# Patient Record
Sex: Male | Born: 1950 | Race: White | Hispanic: No | Marital: Married | Smoking: Never smoker
Health system: Southern US, Community
[De-identification: ages and names within clinical notes are randomized; demographics above are authoritative.]

## PROBLEM LIST (undated history)

## (undated) DIAGNOSIS — N200 Calculus of kidney: Secondary | ICD-10-CM

## (undated) DIAGNOSIS — I10 Essential (primary) hypertension: Secondary | ICD-10-CM

## (undated) DIAGNOSIS — E7800 Pure hypercholesterolemia, unspecified: Secondary | ICD-10-CM

## (undated) HISTORY — PX: HERNIA REPAIR: SHX51

## (undated) HISTORY — PX: CHOLECYSTECTOMY: SHX55

---

## 2014-11-15 ENCOUNTER — Emergency Department (HOSPITAL_BASED_OUTPATIENT_CLINIC_OR_DEPARTMENT_OTHER): Payer: Medicare PPO

## 2014-11-15 ENCOUNTER — Encounter (HOSPITAL_BASED_OUTPATIENT_CLINIC_OR_DEPARTMENT_OTHER): Payer: Self-pay

## 2014-11-15 ENCOUNTER — Emergency Department (HOSPITAL_BASED_OUTPATIENT_CLINIC_OR_DEPARTMENT_OTHER)
Admission: EM | Admit: 2014-11-15 | Discharge: 2014-11-15 | Disposition: A | Discharge status: Home / Self Care | Payer: Medicare PPO | Attending: Emergency Medicine | Admitting: Emergency Medicine

## 2014-11-15 DIAGNOSIS — E780 Pure hypercholesterolemia: Secondary | ICD-10-CM | POA: Insufficient documentation

## 2014-11-15 DIAGNOSIS — R1032 Left lower quadrant pain: Secondary | ICD-10-CM

## 2014-11-15 DIAGNOSIS — Z79899 Other long term (current) drug therapy: Secondary | ICD-10-CM | POA: Diagnosis not present

## 2014-11-15 DIAGNOSIS — I10 Essential (primary) hypertension: Secondary | ICD-10-CM | POA: Diagnosis not present

## 2014-11-15 DIAGNOSIS — N201 Calculus of ureter: Secondary | ICD-10-CM | POA: Insufficient documentation

## 2014-11-15 HISTORY — DX: Calculus of kidney: N20.0

## 2014-11-15 HISTORY — DX: Pure hypercholesterolemia, unspecified: E78.00

## 2014-11-15 HISTORY — DX: Essential (primary) hypertension: I10

## 2014-11-15 LAB — BASIC METABOLIC PANEL
Anion gap: 8 (ref 5–15)
BUN: 16 mg/dL (ref 6–20)
CALCIUM: 9.6 mg/dL (ref 8.9–10.3)
CO2: 26 mmol/L (ref 22–32)
CREATININE: 1.52 mg/dL — AB (ref 0.61–1.24)
Chloride: 104 mmol/L (ref 101–111)
GFR calc non Af Amer: 46 mL/min — ABNORMAL LOW (ref >60)
GFR, EST AFRICAN AMERICAN: 54 mL/min — AB (ref >60)
Glucose, Bld: 119 mg/dL — ABNORMAL HIGH (ref 65–99)
Potassium: 4.4 mmol/L (ref 3.5–5.1)
SODIUM: 138 mmol/L (ref 135–145)

## 2014-11-15 LAB — URINE MICROSCOPIC-ADD ON

## 2014-11-15 LAB — CBC WITH DIFFERENTIAL/PLATELET
BASOS PCT: 0 % (ref 0–1)
Basophils Absolute: 0 10*3/uL (ref 0.0–0.1)
Eosinophils Absolute: 0.8 10*3/uL — ABNORMAL HIGH (ref 0.0–0.7)
Eosinophils Relative: 9 % — ABNORMAL HIGH (ref 0–5)
HEMATOCRIT: 45.8 % (ref 39.0–52.0)
HEMOGLOBIN: 15.6 g/dL (ref 13.0–17.0)
Lymphocytes Relative: 12 % (ref 12–46)
Lymphs Abs: 1.2 10*3/uL (ref 0.7–4.0)
MCH: 28.5 pg (ref 26.0–34.0)
MCHC: 34.1 g/dL (ref 30.0–36.0)
MCV: 83.7 fL (ref 78.0–100.0)
Monocytes Absolute: 0.8 10*3/uL (ref 0.1–1.0)
Monocytes Relative: 9 % (ref 3–12)
NEUTROS ABS: 6.9 10*3/uL (ref 1.7–7.7)
NEUTROS PCT: 70 % (ref 43–77)
PLATELETS: 185 10*3/uL (ref 150–400)
RBC: 5.47 MIL/uL (ref 4.22–5.81)
RDW: 14 % (ref 11.5–15.5)
WBC: 9.8 10*3/uL (ref 4.0–10.5)

## 2014-11-15 LAB — URINALYSIS, ROUTINE W REFLEX MICROSCOPIC
Bilirubin Urine: NEGATIVE
Glucose, UA: NEGATIVE mg/dL
KETONES UR: 15 mg/dL — AB
Leukocytes, UA: NEGATIVE
NITRITE: NEGATIVE
PROTEIN: 30 mg/dL — AB
Specific Gravity, Urine: 1.025 (ref 1.005–1.030)
Urobilinogen, UA: 0.2 mg/dL (ref 0.0–1.0)
pH: 5 (ref 5.0–8.0)

## 2014-11-15 MED ORDER — MORPHINE SULFATE 4 MG/ML IJ SOLN
4.0000 mg | Freq: Once | INTRAMUSCULAR | Status: AC
  Administered 2014-11-15: 4 mg via INTRAVENOUS
  Filled 2014-11-15: qty 1

## 2014-11-15 MED ORDER — ONDANSETRON HCL 4 MG PO TABS: 4.0000 mg | ORAL_TABLET | Freq: Three times a day (TID) | ORAL | Status: AC | PRN

## 2014-11-15 MED ORDER — TAMSULOSIN HCL 0.4 MG PO CAPS: 0.4000 mg | ORAL_CAPSULE | Freq: Every day | ORAL | Status: AC

## 2014-11-15 MED ORDER — ONDANSETRON HCL 4 MG/2ML IJ SOLN
4.0000 mg | Freq: Once | INTRAMUSCULAR | Status: AC
  Administered 2014-11-15: 4 mg via INTRAVENOUS
  Filled 2014-11-15: qty 2

## 2014-11-15 MED ORDER — HYDROCODONE-ACETAMINOPHEN 5-325 MG PO TABS: 1.0000 | ORAL_TABLET | Freq: Four times a day (QID) | ORAL | Status: AC | PRN

## 2014-11-15 NOTE — ED Notes (Signed)
Left groin pain, nausea x 2-3 hours

## 2014-11-15 NOTE — Discharge Instructions (Signed)

## 2014-11-15 NOTE — ED Provider Notes (Signed)
CSN: [id number]     Arrival date & time 11/15/14  1513 History  This chart was scribed for Jesus Divine, MD by Placido Sou, ED scribe. This patient was seen in room MH12/MH12 and the patient's care was started at 5:11 PM.   Chief Complaint  Patient presents with  . Groin Pain   Patient is a 64 y.o. male presenting with groin pain. The history is provided by the patient. No language interpreter was used.  Groin Pain This is a new problem. The current episode started 3 to 5 hours ago. The problem occurs constantly. The problem has been gradually worsening. Pertinent negatives include no abdominal pain. Nothing aggravates the symptoms. Nothing relieves the symptoms. He has tried acetaminophen for the symptoms. The treatment provided no relief.    HPI Comments: Jesus Coleman is a 64 y.o. male who presents to the Emergency Department complaining of worsening, moderate, left sided groin pain with onset 5 hours ago. He notes associated n/v with 2x vomiting since arriving at [facility], rates his current pain as a 6/10, describes it as sharp and further notes taking 2x tylenol 5 hours ago with no relief of his symptoms. Pt notes a hx of kidney stones and further notes the pain was more focused in his flanks and more severe than this occurrence. He denies any changes in his BM's, swelling in the affected region, fever, chills, abd pain or back pain.   Past Medical History  Diagnosis Date  . Kidney stone   . High cholesterol   . Hypertension    Past Surgical History  Procedure Laterality Date  . Cholecystectomy    . Hernia repair     No family history on file. History  Substance Use Topics  . Smoking status: Never Smoker   . Smokeless tobacco: Not on file  . Alcohol Use: No    Review of Systems  Constitutional: Negative for fever and chills.  Gastrointestinal: Positive for nausea and vomiting. Negative for abdominal pain, diarrhea and constipation.  Musculoskeletal: Positive for  myalgias. Negative for back pain.  All other systems reviewed and are negative.  Allergies  Review of patient's allergies indicates no known allergies.  Home Medications   Prior to Admission medications   Medication Sig Start Date End Date Taking? Authorizing Provider  amLODipine-benazepril (LOTREL) 5-10 MG per capsule Take 1 capsule by mouth daily.   Yes Historical Provider, MD  simvastatin (ZOCOR) 20 MG tablet Take 20 mg by mouth daily.   Yes Historical Provider, MD   BP 154/84 mmHg  Pulse 68  Temp(Src) 98.2 F (36.8 C) (Oral)  Resp 18  Ht  (1.803 m)  Wt 215 lb (97.523 kg)  BMI 30.00 kg/m2  SpO2 97% Physical Exam  Constitutional: He is oriented to person, place, and time. He appears well-developed and well-nourished. No distress.  HENT:  Head: Normocephalic and atraumatic.  Mouth/Throat: Oropharynx is clear and moist.  Eyes: Conjunctivae are normal. Pupils are equal, round, and reactive to light. No scleral icterus.  Neck: Neck supple.  Cardiovascular: Normal rate, regular rhythm, normal heart sounds and intact distal pulses.   No murmur heard. Pulmonary/Chest: Effort normal and breath sounds normal. No stridor. No respiratory distress. He has no wheezes. He has no rales.  Abdominal: Soft. He exhibits no distension. There is no tenderness. There is no rebound and no guarding. Hernia confirmed negative in the right inguinal area and confirmed negative in the left inguinal area.  Genitourinary: Right testis shows no mass  and no tenderness. Left testis shows no mass and no tenderness.  Musculoskeletal: Normal range of motion. He exhibits no edema.  Neurological: He is alert and oriented to person, place, and time.  Skin: Skin is warm and dry. No rash noted.  Psychiatric: He has a normal mood and affect. His behavior is normal.  Nursing note and vitals reviewed.   ED Course  Procedures  DIAGNOSTIC STUDIES: Oxygen Saturation is 97% on RA, normal by my interpretation.     COORDINATION OF CARE: 5:20 PM Discussed treatment plan with pt at bedside and pt agreed to plan.  Labs Review Labs Reviewed  URINALYSIS, ROUTINE W REFLEX MICROSCOPIC (NOT AT [facility]) - Abnormal; Notable for the following:    Color, Urine AMBER (*)    APPearance CLOUDY (*)    Hgb urine dipstick LARGE (*)    Ketones, ur 15 (*)    Protein, ur 30 (*)    All other components within normal limits  URINE MICROSCOPIC-ADD ON - Abnormal; Notable for the following:    Bacteria, UA MANY (*)    Casts GRANULAR CAST (*)    All other components within normal limits  CBC WITH DIFFERENTIAL/PLATELET - Abnormal; Notable for the following:    Eosinophils Relative 9 (*)    Eosinophils Absolute 0.8 (*)    All other components within normal limits  BASIC METABOLIC PANEL - Abnormal; Notable for the following:    Glucose, Bld 119 (*)    Creatinine, Ser 1.52 (*)    GFR calc non Af Amer 46 (*)    GFR calc Af Amer 54 (*)    All other components within normal limits   Imaging Review Ct Renal Stone Study  11/15/2014   CLINICAL DATA:  Left groin pain  EXAM: CT ABDOMEN AND PELVIS WITHOUT CONTRAST  TECHNIQUE: Multidetector CT imaging of the abdomen and pelvis was performed following the standard protocol without IV contrast.  COMPARISON:  None.  FINDINGS: There is a 2 mm calculus at the left ureterovesical junction associated with mild left hydronephrosis and perinephric stranding. Tiny calculus in the upper pole of the left kidney. There are several benign cysts in the right kidney.  Postcholecystectomy  Liver, spleen, pancreas, adrenal glands are within normal limits.  Sigmoid diverticulosis without acute diverticulitis  Mild prominence of the prostate gland.  Bladder is unremarkable.  No free-fluid.  Multilevel lumbar spine degenerative disc disease.  IMPRESSION: 2 mm calculus at the left ureterovesical junction is associated with secondary findings of left ureteral obstruction.  Left nephrolithiasis.    Electronically Signed   By: Jolaine Click M.D.   On: 11/15/2014 17:52     EKG Interpretation None     MDM   Final diagnoses:  Left groin pain  Left ureteral stone    64 yo male with left groin pain.  No testicular tenderness or hernias.  Symptoms felt to be secondary to left distal ureteral stone.  Pain controlled.  He will follow up with urology.     I personally performed the services described in this documentation, which was scribed in my presence. The recorded information has been reviewed and is accurate.     Jesus Divine, MD 11/15/14 [phone]

## 2017-01-02 IMAGING — CT CT RENAL STONE PROTOCOL
2 of 4 series · 17 of 46 positions shown, 19 images · non-contrast
Comparison: None.

CLINICAL DATA: Left groin pain

EXAM:
CT ABDOMEN AND PELVIS WITHOUT CONTRAST
TECHNIQUE: Multidetector CT imaging of the abdomen and pelvis was performed
following the standard protocol without IV contrast.

[Series 2: renal stone > 200 lbs 5.0 b31f · axial · 0.87mm/px · z∈[-487,-47]mm · 14 of 97 slices shown, 16 images]
[im 5/97  soft-tissue]
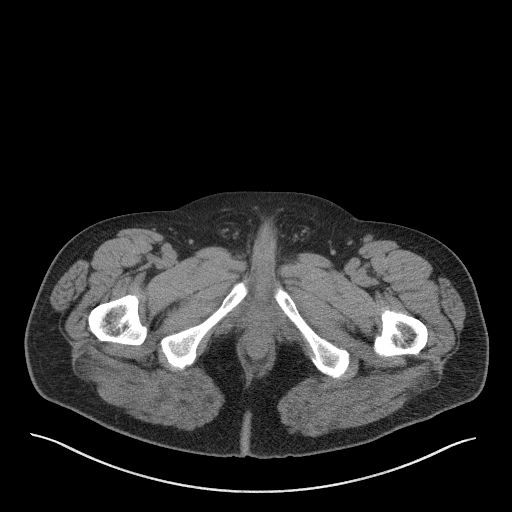
[im 5/97  bone]
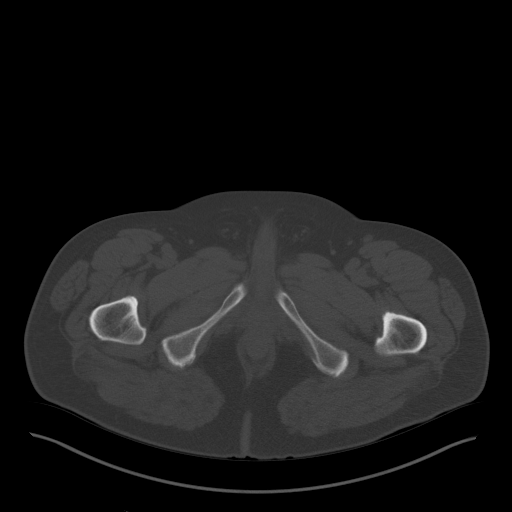
[im 13/97  soft-tissue]
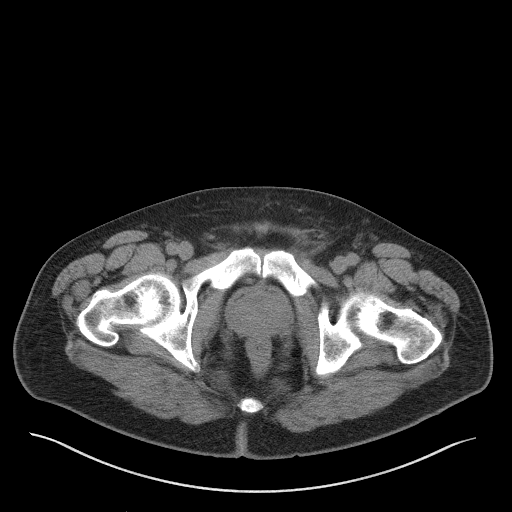
[im 21/97  soft-tissue]
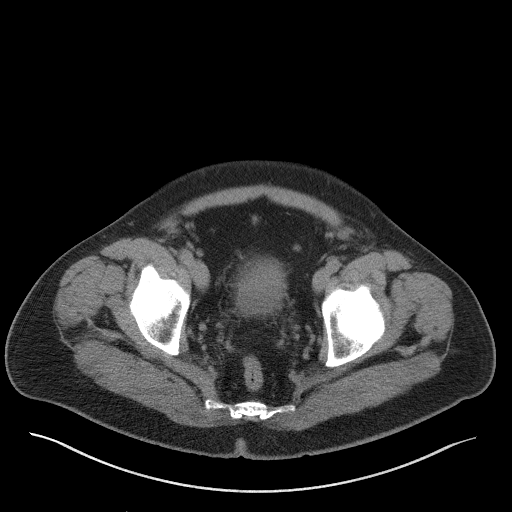
[im 25/97  soft-tissue]
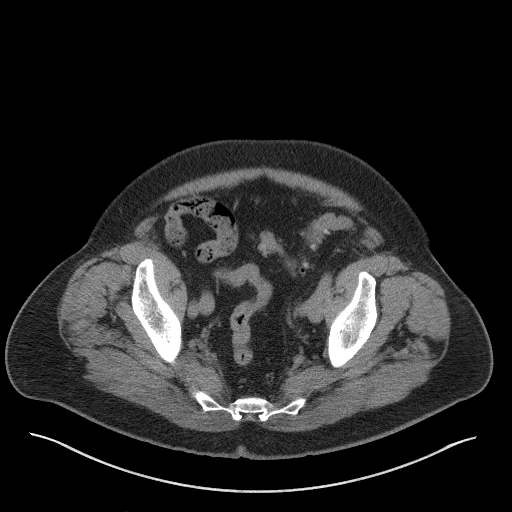
[im 33/97  soft-tissue]
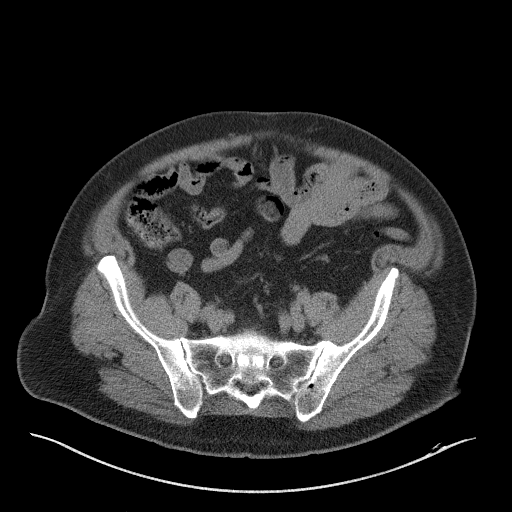
[im 41/97  soft-tissue]
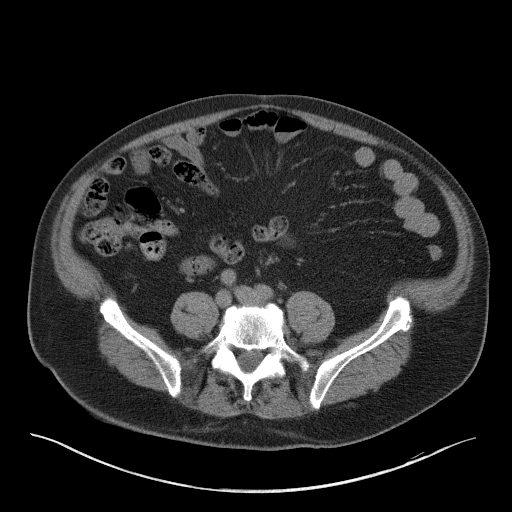
[im 45/97  soft-tissue]
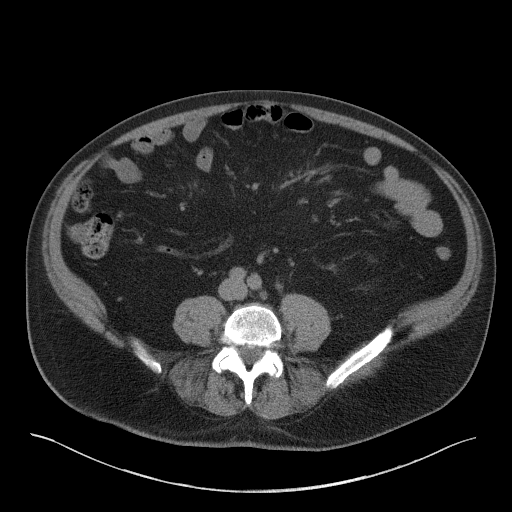
[im 53/97  soft-tissue]
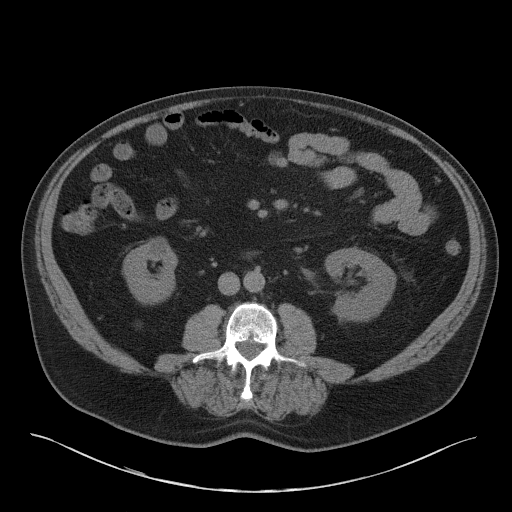
[im 57/97  soft-tissue]
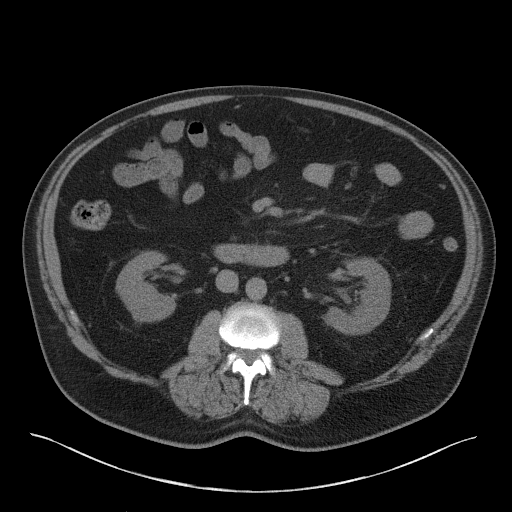
[im 57/97  bone]
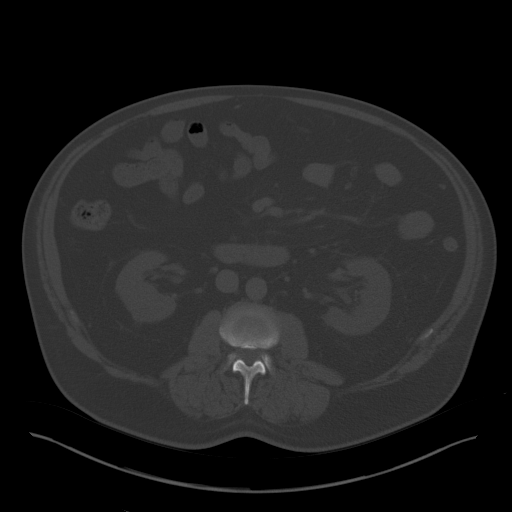
[im 65/97  soft-tissue]
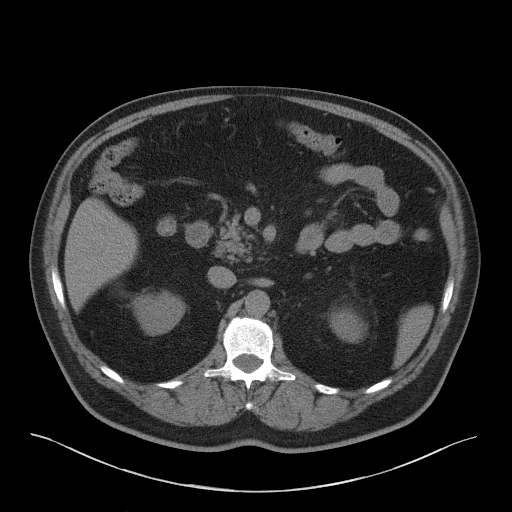
[im 73/97  soft-tissue]
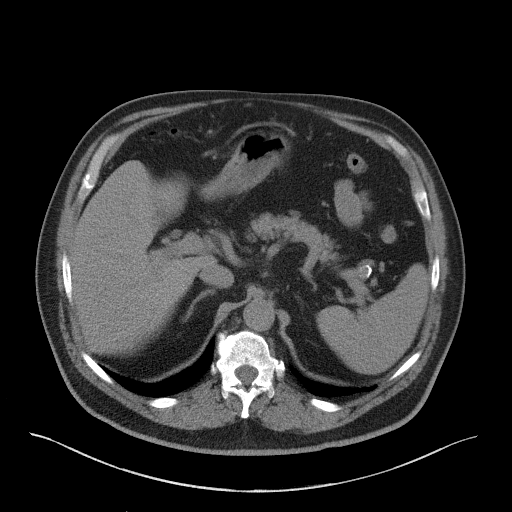
[im 77/97  soft-tissue]
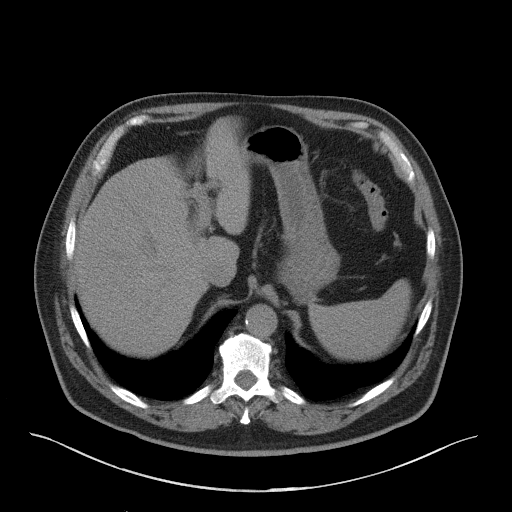
[im 85/97  soft-tissue]
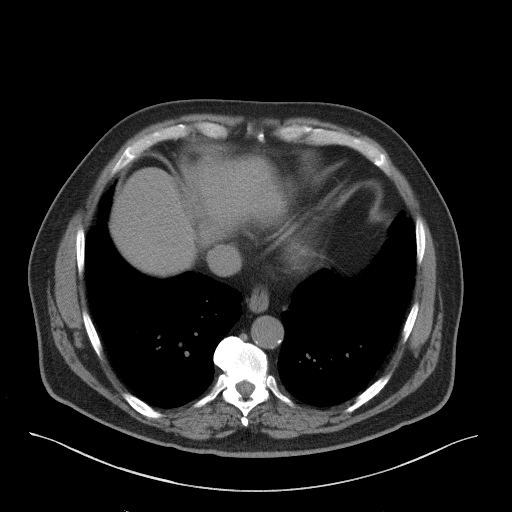
[im 93/97  soft-tissue]
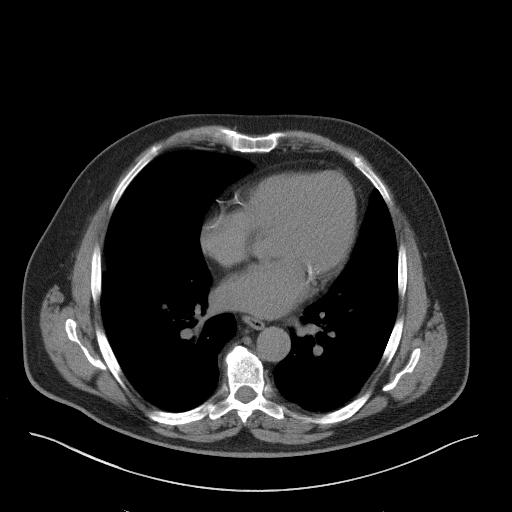

[Series 5: renal stone 3.0 coronal · coronal · 0.86mm/px · 3 of 106 slices shown]
[im 36/106  soft-tissue]
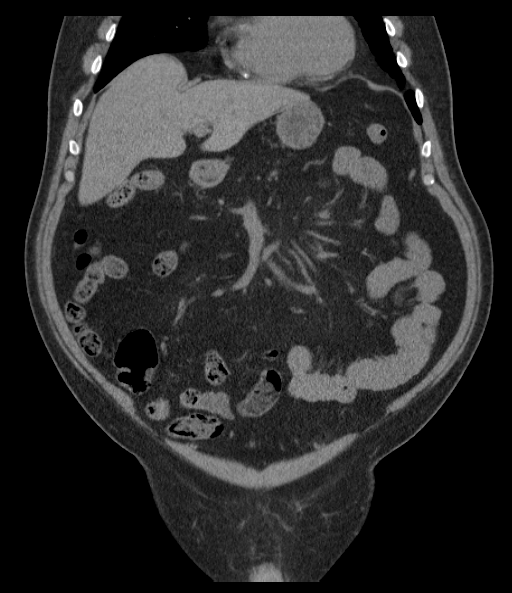
[im 47/106  soft-tissue]
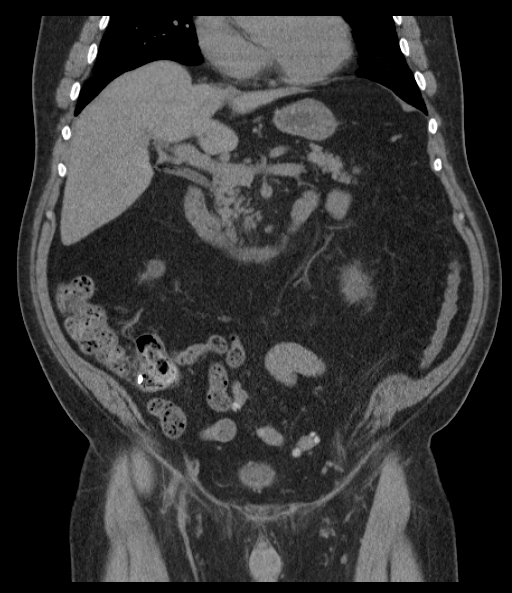
[im 59/106  soft-tissue]
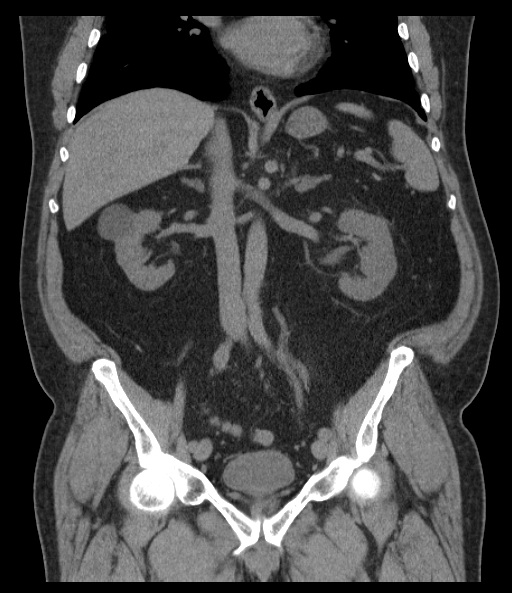

[17 of 46 positions shown; findings below may reference images not displayed]

FINDINGS: There is a 2 mm calculus at the left ureterovesical junction
associated with mild left hydronephrosis and perinephric stranding.
Tiny calculus in the upper pole of the left kidney. There are
several benign cysts in the right kidney.

Postcholecystectomy

Liver, spleen, pancreas, adrenal glands are within normal limits.

Sigmoid diverticulosis without acute diverticulitis

Mild prominence of the prostate gland.  Bladder is unremarkable.

No free-fluid.

Multilevel lumbar spine degenerative disc disease.
IMPRESSION: 2 mm calculus at the left ureterovesical junction is associated with
secondary findings of left ureteral obstruction.

Left nephrolithiasis.
